# Patient Record
Sex: Female | Born: 1990 | Race: White | Hispanic: No | Marital: Single | Smoking: Current some day smoker
Health system: Southern US, Community
[De-identification: ages and names within clinical notes are randomized; demographics above are authoritative.]

## PROBLEM LIST (undated history)

## (undated) DIAGNOSIS — E079 Disorder of thyroid, unspecified: Secondary | ICD-10-CM

## (undated) DIAGNOSIS — O039 Complete or unspecified spontaneous abortion without complication: Secondary | ICD-10-CM

---

## 2017-05-26 ENCOUNTER — Emergency Department
Admission: EM | Admit: 2017-05-26 | Discharge: 2017-05-26 | Disposition: A | Discharge status: Home / Self Care | Payer: Self-pay | Attending: Emergency Medicine | Admitting: Emergency Medicine

## 2017-05-26 ENCOUNTER — Emergency Department: Payer: Self-pay

## 2017-05-26 DIAGNOSIS — Z3A00 Weeks of gestation of pregnancy not specified: Secondary | ICD-10-CM | POA: Insufficient documentation

## 2017-05-26 DIAGNOSIS — F17200 Nicotine dependence, unspecified, uncomplicated: Secondary | ICD-10-CM | POA: Insufficient documentation

## 2017-05-26 DIAGNOSIS — O26891 Other specified pregnancy related conditions, first trimester: Secondary | ICD-10-CM | POA: Insufficient documentation

## 2017-05-26 DIAGNOSIS — O99331 Smoking (tobacco) complicating pregnancy, first trimester: Secondary | ICD-10-CM | POA: Insufficient documentation

## 2017-05-26 DIAGNOSIS — R109 Unspecified abdominal pain: Secondary | ICD-10-CM

## 2017-05-26 DIAGNOSIS — R1030 Lower abdominal pain, unspecified: Secondary | ICD-10-CM | POA: Insufficient documentation

## 2017-05-26 HISTORY — DX: Disorder of thyroid, unspecified: E07.9

## 2017-05-26 HISTORY — DX: Complete or unspecified spontaneous abortion without complication: O03.9

## 2017-05-26 LAB — URINALYSIS, COMPLETE (UACMP) WITH MICROSCOPIC
Bacteria, UA: NONE SEEN
Bilirubin Urine: NEGATIVE
Glucose, UA: NEGATIVE mg/dL
HGB URINE DIPSTICK: NEGATIVE
Ketones, ur: NEGATIVE mg/dL
NITRITE: NEGATIVE
Protein, ur: NEGATIVE mg/dL
Specific Gravity, Urine: 1.024 (ref 1.005–1.030)
pH: 5 (ref 5.0–8.0)

## 2017-05-26 LAB — COMPREHENSIVE METABOLIC PANEL
ALBUMIN: 4.2 g/dL (ref 3.5–5.0)
ALK PHOS: 49 U/L (ref 38–126)
ALT: 13 U/L — ABNORMAL LOW (ref 14–54)
ANION GAP: 7 (ref 5–15)
AST: 20 U/L (ref 15–41)
BILIRUBIN TOTAL: 0.6 mg/dL (ref 0.3–1.2)
BUN: 9 mg/dL (ref 6–20)
CALCIUM: 9.3 mg/dL (ref 8.9–10.3)
CO2: 24 mmol/L (ref 22–32)
Chloride: 106 mmol/L (ref 101–111)
Creatinine, Ser: 0.56 mg/dL (ref 0.44–1.00)
GFR calc Af Amer: >60 mL/min (ref >60)
GFR calc non Af Amer: >60 mL/min (ref >60)
GLUCOSE: 110 mg/dL — AB (ref 65–99)
Potassium: 3.7 mmol/L (ref 3.5–5.1)
Sodium: 137 mmol/L (ref 135–145)
TOTAL PROTEIN: 7.7 g/dL (ref 6.5–8.1)

## 2017-05-26 LAB — CBC
HCT: 38.2 % (ref 35.0–47.0)
Hemoglobin: 13.1 g/dL (ref 12.0–16.0)
MCH: 30.9 pg (ref 26.0–34.0)
MCHC: 34.4 g/dL (ref 32.0–36.0)
MCV: 89.9 fL (ref 80.0–100.0)
Platelets: 272 10*3/uL (ref 150–440)
RBC: 4.25 MIL/uL (ref 3.80–5.20)
RDW: 13.4 % (ref 11.5–14.5)
WBC: 8.4 10*3/uL (ref 3.6–11.0)

## 2017-05-26 LAB — LIPASE, BLOOD: Lipase: 25 U/L (ref 11–51)

## 2017-05-26 LAB — HCG, QUANTITATIVE, PREGNANCY: hCG, Beta Chain, Quant, S: 371 m[IU]/mL — ABNORMAL HIGH (ref <5)

## 2017-05-26 LAB — POCT PREGNANCY, URINE: Preg Test, Ur: POSITIVE — AB

## 2017-05-26 NOTE — ED Triage Notes (Signed)
Pt c/o abdominal cramping. Reports just found out she was pregnant a few days ago, had a miscarriage 3 months ago and is here visiting, wants to make sure everything is ok.

## 2017-05-26 NOTE — ED Notes (Signed)
Pt back from us

## 2017-05-26 NOTE — ED Provider Notes (Signed)
[facility] Emergency Department Provider Note   ____________________________________________    I have reviewed the triage vital signs and the nursing notes.   HISTORY  Chief Complaint Abdominal Cramping     HPI Janice Carlson is a 26 y.o. female Who presents with complaints of lower abdominal cramping. Patient reports she recently found that she is pregnant approximately 3 or 4 days ago. She had a miscarriage 3-4 months ago. Her last period was on August 18. She feels that she is approximately [redacted] weeks pregnant. She complains of lower abdominal cramping which is mild. No vaginal bleeding. No vaginal discharge.G3 P1   Past Medical History:  Diagnosis Date  . Miscarriage   . Thyroid disease     There are no active problems to display for this patient.   No past surgical history on file.  Prior to Admission medications   Not on File     Allergies Patient has no known allergies.  No family history on file.  Social History Social History  Substance Use Topics  . Smoking status: Current Some Day Smoker  . Smokeless tobacco: Not on file  . Alcohol use No    Review of Systems  Constitutional: No fever/chills Eyes: No visual changes.  ENT: No sore throat. Cardiovascular: Denies chest pain. Respiratory: Denies shortness of breath. Gastrointestinal: lower abdominal cramping as above.No nausea, no vomiting.   Genitourinary: Negative for dysuria.no vaginal bleeding Musculoskeletal: Negative for back pain. Skin: Negative for rash. Neurological: Negative for headaches or weakness   ____________________________________________   PHYSICAL EXAM:  VITAL SIGNS: ED Triage Vitals  Enc Vitals Group     BP 05/26/17 1605 135/74     Pulse Rate 05/26/17 1605 84     Resp 05/26/17 1605 14     Temp 05/26/17 1605 98.7 F (37.1 C)     Temp Source 05/26/17 1605 Oral     SpO2 05/26/17 1605 100 %     Weight 05/26/17 1606 77.1 kg (170 lb)   Height 05/26/17 1606 1.575 m ( )     Head Circumference --      Peak Flow --      Pain Score --      Pain Loc --      Pain Edu? --      Excl. in GC? --     Constitutional: Alert and oriented. No acute distress. Pleasant and interactive Eyes: Conjunctivae are normal.   Nose: No congestion/rhinnorhea.   Cardiovascular: Normal rate, regular rhythm. Grossly normal heart sounds.  Good peripheral circulation. Respiratory: Normal respiratory effort.  No retractions. Lungs CTAB. Gastrointestinal: Soft and nontender. No distention.  No CVA tenderness. Genitourinary: deferred Musculoskeletal: No lower extremity tenderness nor edema.  Warm and well perfused Neurologic:  Normal speech and language. No gross focal neurologic deficits are appreciated.  Skin:  Skin is warm, dry and intact. No rash noted. Psychiatric: Mood and affect are normal. Speech and behavior are normal.  ____________________________________________   LABS (all labs ordered are listed, but only abnormal results are displayed)  Labs Reviewed  COMPREHENSIVE METABOLIC PANEL - Abnormal; Notable for the following:       Result Value   Glucose, Bld 110 (*)    ALT 13 (*)    All other components within normal limits  URINALYSIS, COMPLETE (UACMP) WITH MICROSCOPIC - Abnormal; Notable for the following:    Color, Urine YELLOW (*)    APPearance HAZY (*)    Leukocytes, UA TRACE (*)  Squamous Epithelial / LPF 6-30 (*)    All other components within normal limits  HCG, QUANTITATIVE, PREGNANCY - Abnormal; Notable for the following:    hCG, Beta Chain, Quant, S 371 (*)    All other components within normal limits  POCT PREGNANCY, URINE - Abnormal; Notable for the following:    Preg Test, Ur POSITIVE (*)    All other components within normal limits  LIPASE, BLOOD  CBC  POC URINE PREG, ED   ____________________________________________  EKG  None ____________________________________________  RADIOLOGY  Ultrasound  pending ____________________________________________   PROCEDURES  Procedure(s) performed: No    Critical Care performed: No ____________________________________________   INITIAL IMPRESSION / ASSESSMENT AND PLAN / ED COURSE  Pertinent labs & imaging results that were available during my care of the patient were reviewed by me and considered in my medical decision making (see chart for details).  Patient presented with complaints of very mild lower abdominal cramping, center. Approximately [redacted] weeks pregnant, no vaginal bleeding.  Differential includes early pregnancy, threatened miscarriage, ectopic pregnancy We'll obtain ultrasound and reevaluate  ----------------------------------------- 6:53 PM on 05/26/2017 -----------------------------------------  Ultrasound unremarkable. HCG is still quite low, suspect very early pregnancy. Recommend ultrasound repeated in 2 weeks. Patient is comfortable with this plan, will follow up with GYN.    ____________________________________________   FINAL CLINICAL IMPRESSION(S) / ED DIAGNOSES  Final diagnoses:  Abdominal pain during pregnancy in first trimester      NEW MEDICATIONS STARTED DURING THIS VISIT:  New Prescriptions   No medications on file     Note:  This document was prepared using Dragon voice recognition software and may include unintentional dictation errors.    Jene Every, MD 05/26/17 [phone]

## 2019-09-16 IMAGING — US US OB COMP LESS 14 WK
1 series · 14 of 28 positions shown · non-contrast
Comparison: None.

CLINICAL DATA: Pelvic cramping x2 days.  Beta HCG 371

EXAM:
OBSTETRIC <14 WK US AND TRANSVAGINAL OB US
TECHNIQUE: Both transabdominal and transvaginal ultrasound examinations were
performed for complete evaluation of the gestation as well as the
maternal uterus, adnexal regions, and pelvic cul-de-sac.
Transvaginal technique was performed to assess early pregnancy.

[Series 1: us ob comp less 14 wk · 0.28mm/px · 14 of 100 slices shown]
[im 4/100]
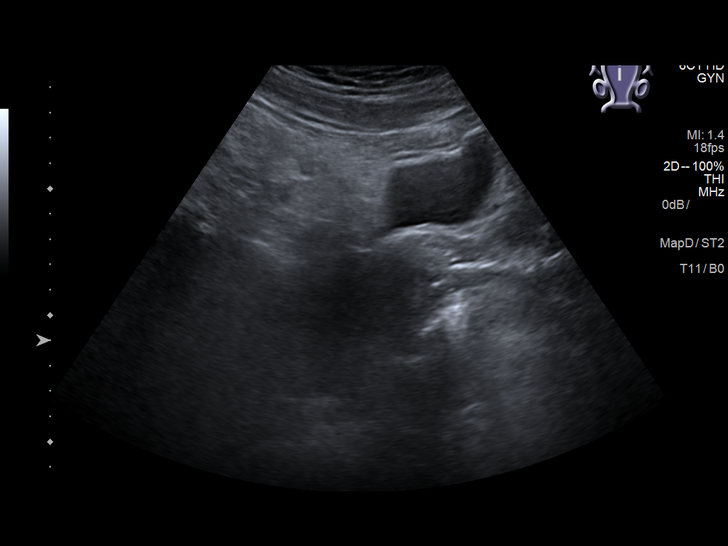
[im 12/100]
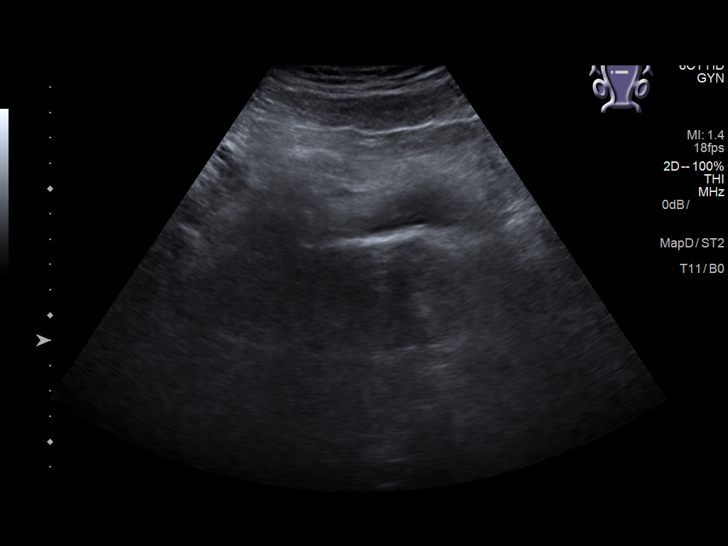
[im 19/100]
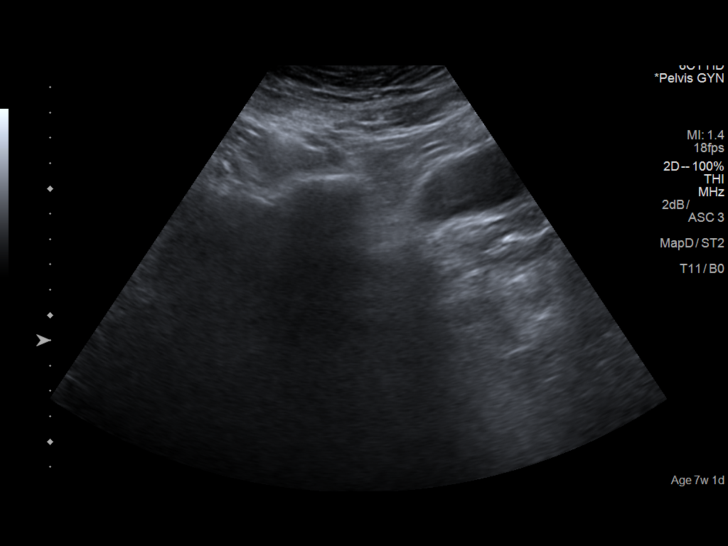
[im 26/100]
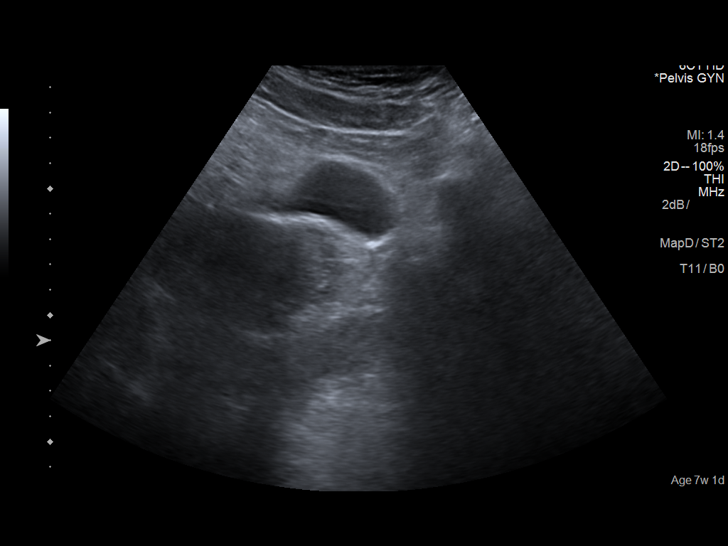
[im 34/100]
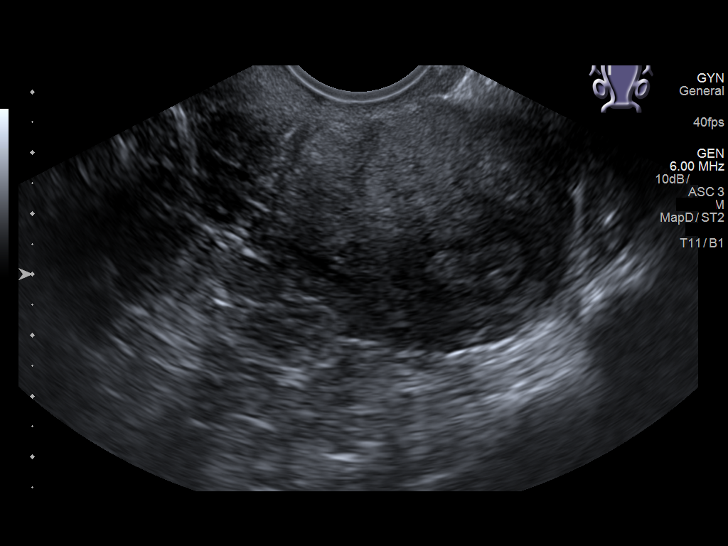
[im 41/100]
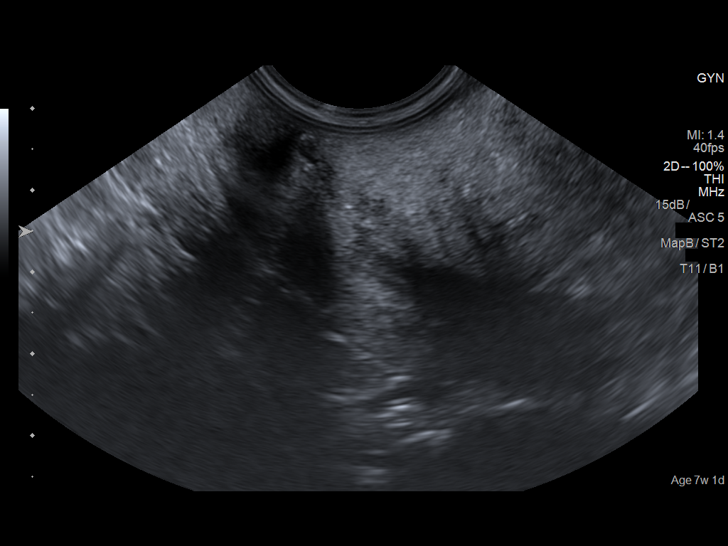
[im 48/100]
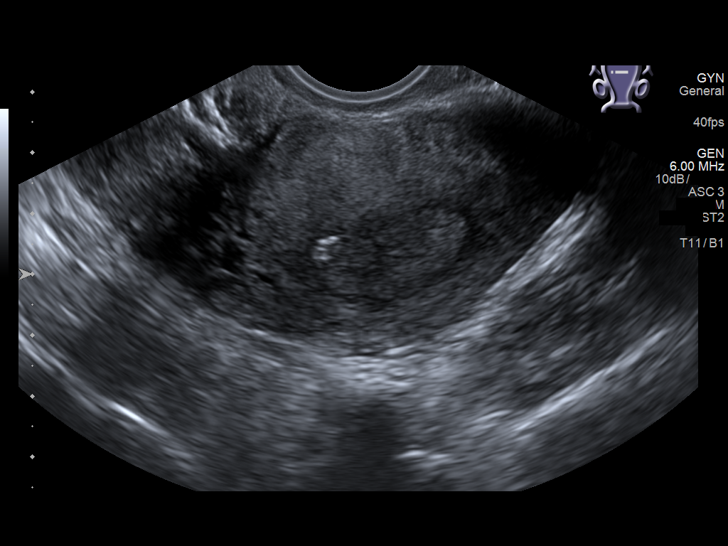
[im 56/100]
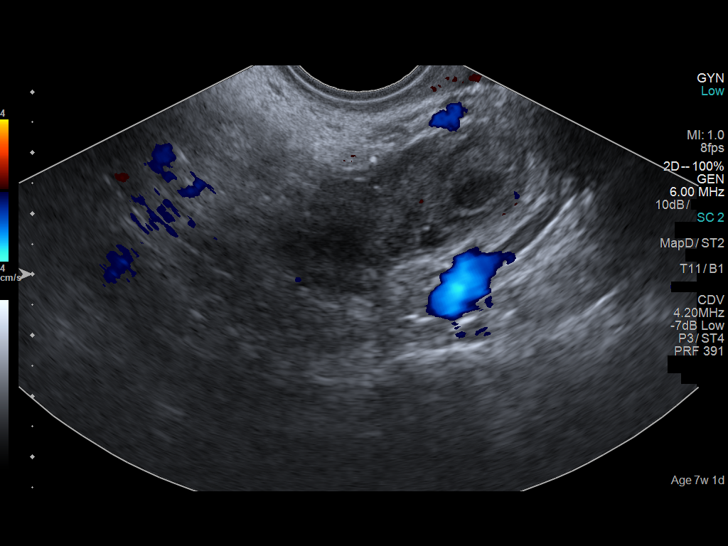
[im 63/100]
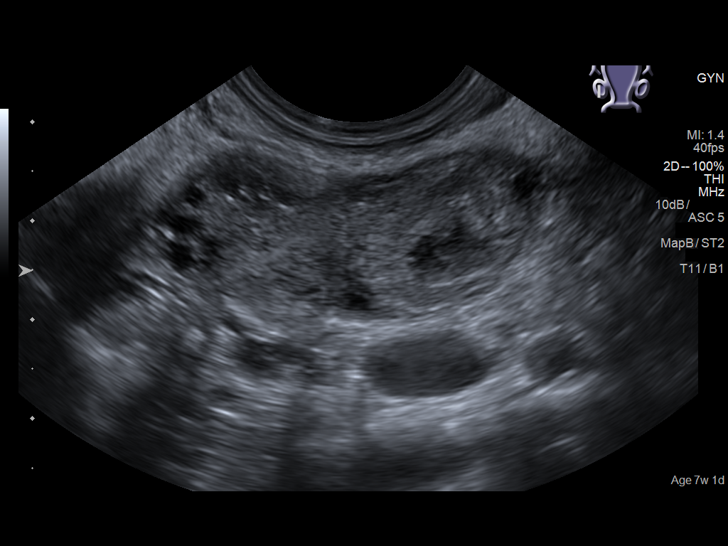
[im 70/100]
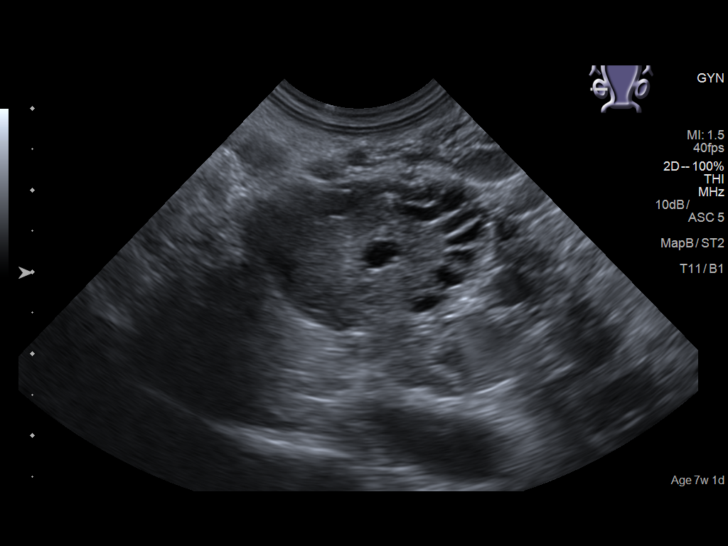
[im 78/100]
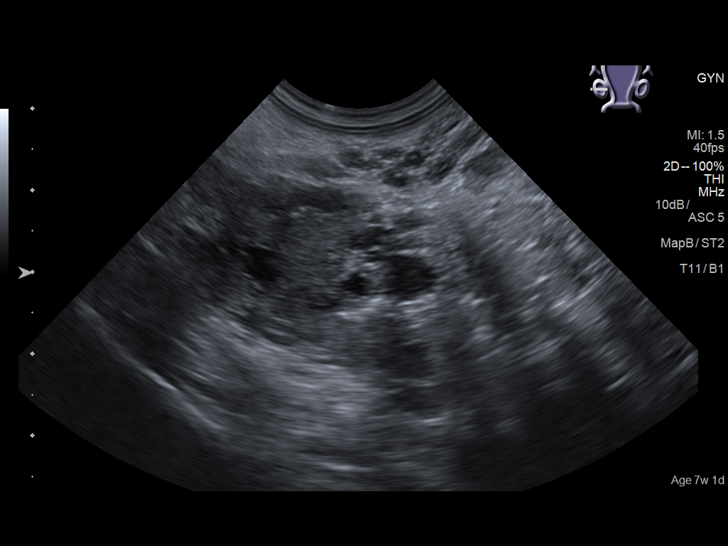
[im 85/100]
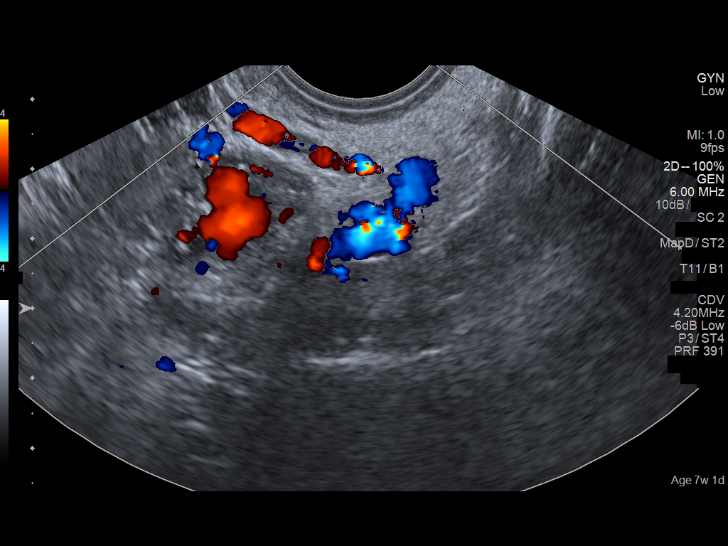
[im 92/100]
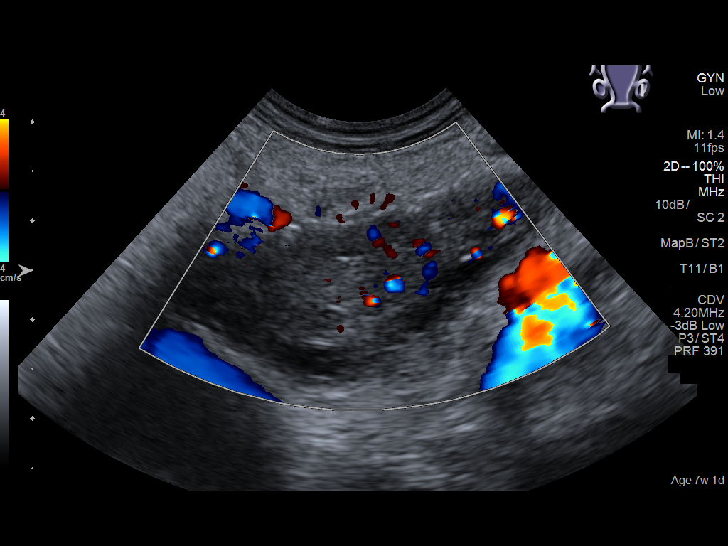
[im 100/100]
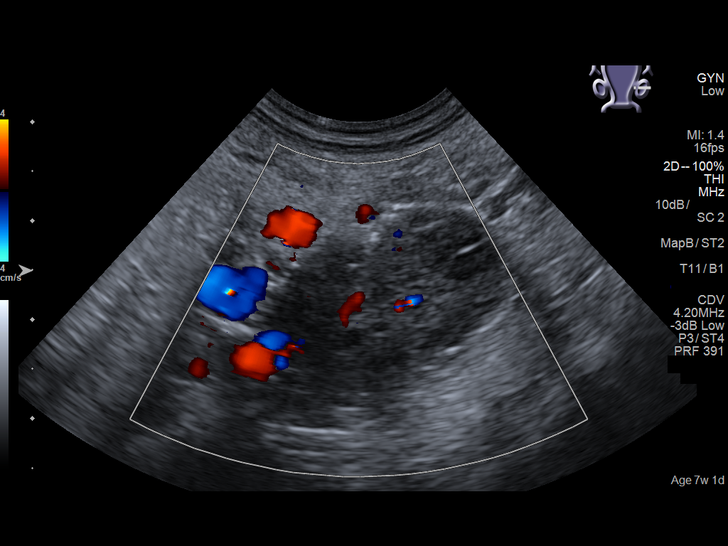

[14 of 28 positions shown; findings below may reference images not displayed]

FINDINGS: Intrauterine gestational sac: None

Yolk sac:  Not Visualized.

Embryo:  Not Visualized.

Cardiac Activity: Not Visualized.

Heart Rate: Not applicable

Subchorionic hemorrhage:  None visualized.

Maternal uterus/adnexae: Corpus luteum or hemorrhagic cyst of the
right ovary measuring 1.6 x 1.2 by 1.8 cm. Normal left ovary.
IMPRESSION: No intrauterine or definite ectopic pregnancy is identified. Given
the low beta HCG, findings may be too early to detect the pregnancy
sonographically. Recommend follow-up quantitative B-HCG levels and
follow-up US in 14 days to determine the presence of a pregnancy.
